# Patient Record
Sex: Male | Born: 1995 | Race: Black or African American | Hispanic: No | Marital: Single | Smoking: Never smoker
Health system: Southern US, Community
[De-identification: ages and names within clinical notes are randomized; demographics above are authoritative.]

## PROBLEM LIST (undated history)

## (undated) DIAGNOSIS — F909 Attention-deficit hyperactivity disorder, unspecified type: Secondary | ICD-10-CM

---
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
## 2014-08-30 ENCOUNTER — Encounter (HOSPITAL_COMMUNITY): Payer: Self-pay | Admitting: Emergency Medicine

## 2014-08-30 ENCOUNTER — Emergency Department (HOSPITAL_COMMUNITY)
Admission: EM | Admit: 2014-08-30 | Discharge: 2014-08-30 | Disposition: A | Discharge status: Home / Self Care | Payer: No Typology Code available for payment source | Attending: Emergency Medicine | Admitting: Emergency Medicine

## 2014-08-30 DIAGNOSIS — Y929 Unspecified place or not applicable: Secondary | ICD-10-CM | POA: Insufficient documentation

## 2014-08-30 DIAGNOSIS — Z79899 Other long term (current) drug therapy: Secondary | ICD-10-CM | POA: Insufficient documentation

## 2014-08-30 DIAGNOSIS — Z880 Allergy status to penicillin: Secondary | ICD-10-CM | POA: Insufficient documentation

## 2014-08-30 DIAGNOSIS — S01112A Laceration without foreign body of left eyelid and periocular area, initial encounter: Secondary | ICD-10-CM | POA: Insufficient documentation

## 2014-08-30 DIAGNOSIS — Y998 Other external cause status: Secondary | ICD-10-CM | POA: Insufficient documentation

## 2014-08-30 DIAGNOSIS — Z8659 Personal history of other mental and behavioral disorders: Secondary | ICD-10-CM | POA: Insufficient documentation

## 2014-08-30 DIAGNOSIS — J45909 Unspecified asthma, uncomplicated: Secondary | ICD-10-CM | POA: Insufficient documentation

## 2014-08-30 DIAGNOSIS — Y9389 Activity, other specified: Secondary | ICD-10-CM | POA: Insufficient documentation

## 2014-08-30 DIAGNOSIS — W2203XA Walked into furniture, initial encounter: Secondary | ICD-10-CM | POA: Insufficient documentation

## 2014-08-30 MED ORDER — LIDOCAINE-EPINEPHRINE 1 %-1:100000 IJ SOLN
10.0000 mL | Freq: Once | INTRAMUSCULAR | Status: AC
Start: 2014-08-30 — End: 2014-08-30
  Administered 2014-08-30: 10 mL
  Filled 2014-08-30: qty 10

## 2014-08-30 MED ORDER — DOUBLE ANTIBIOTIC 500-10000 UNIT/GM EX OINT: TOPICAL_OINTMENT | Freq: Once | CUTANEOUS | Status: DC

## 2014-08-30 MED ORDER — BACITRACIN ZINC 500 UNIT/GM EX OINT
TOPICAL_OINTMENT | CUTANEOUS | Status: AC
  Administered 2014-08-30: 08:00:00 via TOPICAL
  Filled 2014-08-30: qty 0.9

## 2014-08-30 MED ORDER — LIDOCAINE-EPINEPHRINE (PF) 1 %-1:200000 IJ SOLN
INTRAMUSCULAR | Status: AC
  Filled 2014-08-30: qty 10
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
## 2014-08-30 NOTE — Discharge Instructions (Signed)

## 2014-08-30 NOTE — ED Provider Notes (Signed)
CSN: [id number]     Arrival date & time 08/30/14  [id number] History   First MD Initiated Contact with Patient 08/30/14 [phone]     Chief Complaint  Patient presents with  . Laceration     (Consider location/radiation/quality/duration/timing/severity/associated sxs/prior Treatment) HPI Comments: 18 year old male presents shortly after striking his left eyebrow and a door. He had acute onset of a laceration which is mild, persistent, associated with a small amount of bleeding but no loss of consciousness nausea vomiting or change in vision. He has applied pressure to the wound which stopped bleeding, up-to-date on tetanus.  Patient is a 18 y.o. male presenting with skin laceration. The history is provided by the patient.  Laceration   Past Medical History  Diagnosis Date  . Asthma   . ADHD (attention deficit hyperactivity disorder)    History reviewed. No pertinent past surgical history. Family History  Problem Relation Age of Onset  . Diabetes Other   . Cancer Other   . Hyperlipidemia Father    History  Substance Use Topics  . Smoking status: Never Smoker   . Smokeless tobacco: Never Used  . Alcohol Use: No    Review of Systems  Constitutional: Negative for fever.  Gastrointestinal: Negative for vomiting.  Skin: Positive for wound.       Laceration  Neurological: Negative for weakness and numbness.      Allergies  Penicillins  Home Medications   Prior to Admission medications   Medication Sig Start Date End Date Taking? Authorizing Provider  albuterol (PROAIR HFA) 108 (90 BASE) MCG/ACT inhaler Inhale 2 puffs into the lungs as needed. For shortness of breath     Historical Provider, MD  amphetamine-dextroamphetamine (ADDERALL XR) 30 MG 24 hr capsule Take 30 mg by mouth daily.    Historical Provider, MD   BP 125/81 mmHg  Pulse 75  Temp(Src) 97.9 F (36.6 C) (Oral)  Resp 18  Ht 5\' 5"  (1.651 m)  Wt 135 lb (61.236 kg)  BMI 22.47 kg/m2  SpO2 100% Physical Exam   Constitutional: He appears well-developed and well-nourished. No distress.  HENT:  Head: Normocephalic.  1.5 cm laceration to the left eyebrow  Eyes: Conjunctivae are normal. No scleral icterus.  Cardiovascular: Normal rate and regular rhythm.   Pulmonary/Chest: Effort normal and breath sounds normal.  Musculoskeletal: Normal range of motion. He exhibits tenderness ( ttp over the laceration site ). He exhibits no edema.  Neurological: He is alert. Coordination normal.  Sensation and motor intact  Skin: Skin is warm and dry. He is not diaphoretic.  Laceration located on left eyebrow The Laceration is linear shaped The depth is subcutaneous The length is 1.5 cm    ED Course  Procedures (including critical care time) Labs Review Labs Reviewed - No data to display  Imaging Review No results found.    MDM   Final diagnoses:  Laceration of eyebrow, left, initial encounter    LACERATION REPAIR Performed by: Vida RollerMILLER,Aisea Bouldin D Authorized by: Vida RollerMILLER,Mychelle Kendra D Consent: Verbal consent obtained. Risks and benefits: risks, benefits and alternatives were discussed Consent given by: patient Patient identity confirmed: provided demographic data Prepped and Draped in normal sterile fashion Wound explored  Laceration Location: Left eyebrow  Laceration Length: 1.5 cm  No Foreign Bodies seen or palpated  Anesthesia: local infiltration  Local anesthetic: lidocaine 1 % with epinephrine  Anesthetic total: 1 ml  Irrigation method: syringe Amount of cleaning: standard  Skin closure: 6-0 Prolene   Number of sutures: 2  Technique: Simple interrupted   Patient tolerance: Patient tolerated the procedure well with no immediate complications.     Vida RollerBrian D Allia Wiltsey, MD 08/30/14 [phone]

## 2014-08-30 NOTE — ED Notes (Addendum)
EDP at bedside. Suture cart/tray and 1% lidocaine with epi provided per verbal order of EDP.

## 2016-06-08 ENCOUNTER — Ambulatory Visit (HOSPITAL_COMMUNITY)
Admission: AD | Admit: 2016-06-08 | Discharge: 2016-06-08 | Disposition: A | Discharge status: Home / Self Care | Payer: Medicaid Other | Source: Other Acute Inpatient Hospital | Attending: Gastroenterology | Admitting: Gastroenterology

## 2016-06-08 ENCOUNTER — Emergency Department (HOSPITAL_COMMUNITY)
Admission: EM | Admit: 2016-06-08 | Discharge: 2016-06-08 | Disposition: A | Discharge status: Home / Self Care | Payer: Self-pay | Attending: Emergency Medicine | Admitting: Emergency Medicine

## 2016-06-08 ENCOUNTER — Encounter (HOSPITAL_COMMUNITY): Payer: Self-pay | Admitting: Emergency Medicine

## 2016-06-08 ENCOUNTER — Telehealth: Payer: Self-pay | Admitting: Gastroenterology

## 2016-06-08 ENCOUNTER — Other Ambulatory Visit: Payer: Self-pay | Admitting: Gastroenterology

## 2016-06-08 ENCOUNTER — Encounter (HOSPITAL_COMMUNITY): Payer: Self-pay | Admitting: *Deleted

## 2016-06-08 ENCOUNTER — Encounter (HOSPITAL_COMMUNITY)
Admission: AD | Disposition: A | Discharge status: Home / Self Care | Payer: Self-pay | Source: Other Acute Inpatient Hospital | Attending: Gastroenterology

## 2016-06-08 DIAGNOSIS — J45909 Unspecified asthma, uncomplicated: Secondary | ICD-10-CM | POA: Insufficient documentation

## 2016-06-08 DIAGNOSIS — Y929 Unspecified place or not applicable: Secondary | ICD-10-CM | POA: Insufficient documentation

## 2016-06-08 DIAGNOSIS — X58XXXA Exposure to other specified factors, initial encounter: Secondary | ICD-10-CM | POA: Insufficient documentation

## 2016-06-08 DIAGNOSIS — Y9389 Activity, other specified: Secondary | ICD-10-CM | POA: Insufficient documentation

## 2016-06-08 DIAGNOSIS — R1310 Dysphagia, unspecified: Secondary | ICD-10-CM

## 2016-06-08 DIAGNOSIS — K222 Esophageal obstruction: Secondary | ICD-10-CM | POA: Insufficient documentation

## 2016-06-08 DIAGNOSIS — T18108A Unspecified foreign body in esophagus causing other injury, initial encounter: Secondary | ICD-10-CM | POA: Insufficient documentation

## 2016-06-08 DIAGNOSIS — K210 Gastro-esophageal reflux disease with esophagitis: Secondary | ICD-10-CM | POA: Insufficient documentation

## 2016-06-08 DIAGNOSIS — F909 Attention-deficit hyperactivity disorder, unspecified type: Secondary | ICD-10-CM | POA: Insufficient documentation

## 2016-06-08 DIAGNOSIS — Y999 Unspecified external cause status: Secondary | ICD-10-CM | POA: Insufficient documentation

## 2016-06-08 DIAGNOSIS — T18128A Food in esophagus causing other injury, initial encounter: Secondary | ICD-10-CM

## 2016-06-08 HISTORY — PX: ESOPHAGOGASTRODUODENOSCOPY: SHX5428

## 2016-06-08 SURGERY — EGD (ESOPHAGOGASTRODUODENOSCOPY)
Anesthesia: Moderate Sedation

## 2016-06-08 MED ORDER — PROMETHAZINE HCL 25 MG/ML IJ SOLN
INTRAMUSCULAR | Status: AC
  Filled 2016-06-08: qty 1

## 2016-06-08 MED ORDER — LIDOCAINE VISCOUS 2 % MT SOLN
OROMUCOSAL | Status: AC
  Filled 2016-06-08: qty 15

## 2016-06-08 MED ORDER — MINERAL OIL PO OIL
TOPICAL_OIL | ORAL | Status: AC
  Filled 2016-06-08: qty 30

## 2016-06-08 MED ORDER — LIDOCAINE VISCOUS 2 % MT SOLN
OROMUCOSAL | Status: DC | PRN
  Administered 2016-06-08: 4 mL via OROMUCOSAL

## 2016-06-08 MED ORDER — ESOMEPRAZOLE MAGNESIUM 40 MG PO CPDR: DELAYED_RELEASE_CAPSULE | ORAL | 11 refills | Status: DC

## 2016-06-08 MED ORDER — GLUCAGON HCL RDNA (DIAGNOSTIC) 1 MG IJ SOLR
1.0000 mg | Freq: Once | INTRAMUSCULAR | Status: AC
  Administered 2016-06-08: 1 mg via INTRAVENOUS
  Filled 2016-06-08: qty 1

## 2016-06-08 MED ORDER — STERILE WATER FOR IRRIGATION IR SOLN
Status: DC | PRN
  Administered 2016-06-08: 100 mL

## 2016-06-08 MED ORDER — PROMETHAZINE HCL 25 MG/ML IJ SOLN
INTRAMUSCULAR | Status: DC | PRN
  Administered 2016-06-08: 12.5 mg via INTRAVENOUS

## 2016-06-08 MED ORDER — MIDAZOLAM HCL 5 MG/5ML IJ SOLN
INTRAMUSCULAR | Status: AC
  Filled 2016-06-08: qty 5

## 2016-06-08 MED ORDER — MEPERIDINE HCL 100 MG/ML IJ SOLN
INTRAMUSCULAR | Status: DC | PRN
  Administered 2016-06-08: 25 mg via INTRAVENOUS
  Administered 2016-06-08: 50 mg via INTRAVENOUS

## 2016-06-08 MED ORDER — PROMETHAZINE HCL 25 MG/ML IJ SOLN
25.0000 mg | INTRAMUSCULAR | Status: AC
  Administered 2016-06-08: 12.5 mg via INTRAVENOUS

## 2016-06-08 MED ORDER — MEPERIDINE HCL 100 MG/ML IJ SOLN
INTRAMUSCULAR | Status: AC
  Filled 2016-06-08: qty 2

## 2016-06-08 MED ORDER — ONDANSETRON HCL 4 MG/2ML IJ SOLN
4.0000 mg | Freq: Once | INTRAMUSCULAR | Status: AC
  Administered 2016-06-08: 4 mg via INTRAVENOUS
  Filled 2016-06-08: qty 2

## 2016-06-08 MED ORDER — MIDAZOLAM HCL 5 MG/5ML IJ SOLN
INTRAMUSCULAR | Status: DC | PRN
  Administered 2016-06-08 (×2): 1 mg via INTRAVENOUS
  Administered 2016-06-08: 2 mg via INTRAVENOUS
  Administered 2016-06-08: 1 mg via INTRAVENOUS

## 2016-06-08 MED ORDER — SODIUM CHLORIDE 0.9 % IV SOLN
INTRAVENOUS | Status: DC
  Administered 2016-06-08: 17:00:00 via INTRAVENOUS

## 2016-06-08 MED ORDER — MIDAZOLAM HCL 5 MG/5ML IJ SOLN
INTRAMUSCULAR | Status: AC
  Filled 2016-06-08: qty 10

## 2016-06-08 NOTE — Discharge Instructions (Signed)
Call Dr. Darrick Pennafields to schedule outpatient endoscopy to evaluate your esophagus for a stricture.  Nexium daily.  Chew your food well. Swallow only small bites

## 2016-06-08 NOTE — H&P (Addendum)
  Primary Care Physician:  Fredirick MaudlinHAWKINS,EDWARD L, MD Primary Gastroenterologist:  Dr. Darrick PennaFields  Pre-Procedure History & Physical: HPI:  Steven Pittman is a 20 y.o. male here for DYSPHAGIA/POSSIBLE FOOD-PILL IMPACTION. PT HAS INTERMITTENT REFLUX. USES NEXIUM PRN. WAS EATING ROAST BEEF LAST NIGHT AND FELT LIKE IT WAS STUCK. HE TOLD HIS MOTHER @ 6AM. PT SEEN IN ED AND GIVEN GLUCAGON. ABLE TO TOLERATE WATER BUT WENT HOME @9  am  AND WAS UNABLE TO SWALLOW PILLS OR WATER. THREW UP AFTER TRYING TO DRINK WATER (NO BLOOD). PT DENIES USING ASA/NSAID. RARE ETOH.   PT DENIES FEVER, CHILLS, HEMATOCHEZIA, HEMATEMESIS, nausea, melena, diarrhea, CHEST PAIN, SHORTNESS OF BREATH,  CHANGE IN BOWEL IN HABITS, constipation, abdominal pain,OR problems with sedation.    Past Medical History:  Diagnosis Date  . ADHD (attention deficit hyperactivity disorder)   . Asthma     History reviewed. No pertinent surgical history.  Prior to Admission medications   Medication Sig Start Date End Date Taking? Authorizing Provider  amphetamine-dextroamphetamine (ADDERALL XR) 30 MG 24 hr capsule Take 30 mg by mouth daily.   Yes Historical Provider, MD  albuterol (PROAIR HFA) 108 (90 BASE) MCG/ACT inhaler Inhale 2 puffs into the lungs as needed. For shortness of breath     Historical Provider, MD    Allergies as of 06/08/2016 - Review Complete 06/08/2016  Allergen Reaction Noted  . Penicillins Rash 06/06/2011    Family History  Problem Relation Age of Onset  . Diabetes Other   . Cancer Other   . Hyperlipidemia Father     Social History   Social History  . Marital status: Single    Spouse name: N/A  . Number of children: N/A  . Years of education: N/A   Occupational History  . Not on file.   Social History Main Topics  . Smoking status: Never Smoker  . Smokeless tobacco: Never Used  . Alcohol use Yes     Comment: occasionally   . Drug use: No  . Sexual activity: No   Other Topics Concern  . Not on  file   Social History Narrative  . No narrative on file    Review of Systems: See HPI, otherwise negative ROS  Physical Exam: BP 124/79   Pulse 72   Temp 98.6 F (37 C) (Oral)   Resp 16   Ht 5\' 5"  (1.651 m)   Wt 150 lb (68 kg)   SpO2 100%   BMI 24.96 kg/m  General:   Alert,  pleasant and cooperative in NAD Head:  Normocephalic and atraumatic. Neck:  Supple; Lungs:  Clear throughout to auscultation.    Heart:  Regular rate and rhythm. Abdomen:  Soft, nontender and nondistended. Normal bowel sounds, without guarding, and without rebound.   Neurologic:  Alert and  oriented x4;  grossly normal neurologically.  Impression/Plan:    DYSPHAGIA/POSSIBLE FOOD-PILL IMPACTION  PLAN:  EGD/POSSIBLE DIL TODAY. DISCUSSED WITH PATIENT AND MOTHER PROCEDURE, BENEFITS, & RISKS: < 1% chance of medication reaction, PERFORATION, OR bleeding.

## 2016-06-08 NOTE — Telephone Encounter (Signed)
Pt seen in ED FOR FOOD IMPACTION. OPV THIS WEEK, Dx: DYSPHAGIA. NEEDS EGD W/ MAC DUE TO ADDERALL USE.

## 2016-06-08 NOTE — Telephone Encounter (Signed)
MADE APPT AND LEFT MESSAGE ON PATIENT HOME AND CELL NUMBERS

## 2016-06-08 NOTE — Progress Notes (Signed)
Received call from Dr. Juanetta GoslingHawkins. Patient was discharged from ED after being seen for food impaction. After receiving trial of Glucagon, had relief of symptoms and was able to drink 2 full glass of water. Patient was sent home and arranged to be seen by our practice. However, he took his Adderall pill later during the day and felt like it got lodged. He attempted to drink water, which came back up. I spoke to patient personally and asked him to take small sips of water. He then had to vomit multiple times, unable to tolerate liquids. I spoke to his mother, Steven Pittman, on the phone as well. She stated that the patient was then drooling and having difficulty with secretions as well, feeling like he did earlier in the day.   Discussed with Dr. Darrick PennaFields. Patient will go straight to the endoscopy unit. EGD/ED with 25 Phenergan IV in pre-op arranged. Informed Yolanda, who will take Steven Pittman to the endoscopy unit. Orders placed.  Gelene MinkAnna W. Ranjit Ashurst, ANP-BC [facility]

## 2016-06-08 NOTE — Op Note (Signed)
[facility] Patient Name: Steven Pittman Procedure Date: 06/08/2016 4:55 PM MRN: [id number] Date of Birth: January 14, 1996 Attending MD: Jonette Eva , MD CSN: [id number] Age: 20 Admit Type: Outpatient Procedure:                Upper GI endoscopy with ESOPHAGEAL BIOPSY/DILATION Indications:              Dysphagia, PROBABLE FOOD BOLUS in the esophagus Providers:                Jonette Eva, MD, Edrick Kins, RN, Burke Keels,                            Technician Referring MD:             Oneal Deputy. Juanetta Gosling, MD Medicines:                Promethazine 25 mg IV, Meperidine 75 mg IV,                            Midazolam 5 mg IV Complications:            No immediate complications. Estimated Blood Loss:     Estimated blood loss was minimal. Procedure:                Pre-Anesthesia Assessment:                           - Prior to the procedure, a History and Physical                            was performed, and patient medications and                            allergies were reviewed. The patient's tolerance of                            previous anesthesia was also reviewed. The risks                            and benefits of the procedure and the sedation                            options and risks were discussed with the patient.                            All questions were answered, and informed consent                            was obtained. Prior Anticoagulants: The patient has                            taken no previous anticoagulant or antiplatelet                            agents. ASA Grade Assessment: I - A normal, healthy  patient. After reviewing the risks and benefits,                            the patient was deemed in satisfactory condition to                            undergo the procedure. After obtaining informed                            consent, the endoscope was passed under direct                            vision. Throughout  the procedure, the patient's                            blood pressure, pulse, and oxygen saturations were                            monitored continuously. The EG-[id number] ([id number])                            scope was introduced through the mouth, and                            advanced to the second part of duodenum. The upper                            GI endoscopy was somewhat difficult due to the                            patient's agitation. Successful completion of the                            procedure was aided by increasing the dose of                            sedation medication. The patient tolerated the                            procedure fairly well. Scope In: 5:19:03 PM Scope Out: 5:40:27 PM Total Procedure Duration: 0 hours 21 minutes 24 seconds  Findings:      Food was found in the distal esophagus. Removal of food was accomplished.      Esophagitis was found 30 to 39 cm from the incisors. Biopsies were       obtained from the proximal(20 CM FROM THE INCISORS) and distal       esophagus(34 CM FROM THE INCISORS) with cold forceps for histology of       suspected eosinophilic esophagitis. Estimated blood loss was minimal.      The entire examined stomach was normal.      One moderate benign-appearing, intrinsic stricture was found 39 cm(EG       jxn) from the incisors. And was traversed. A guidewire was placed and       the scope was withdrawn. Dilation was  performed with a Savary dilator       with mild resistance at 12.8 mm and 14 mm and moderate resistance at 15       mm and 16 mm. TRACE HEME SEEN AFTER DILATION.      The examined duodenum was normal. Impression:               - Food in the distal esophagus. Removal was                            successfuLLY VIA ROTH NET.                           - MILD Reflux esophagitis.                           - Benign-appearing esophageal PEPTIC STRICTURE                           - Moderate Sedation:      Moderate  (conscious) sedation was administered by the endoscopy nurse       and supervised by the endoscopist. The following parameters were       monitored: oxygen saturation, heart rate, blood pressure, and response       to care. Total physician intraservice time was 27 minutes. Recommendation:           - Resume previous diet. AVOID REFLUX TRIGGERS.                           - Use Nexium (esomeprazole) 40 mg PO BID                            indefinitely.                           - Await pathology results. CALL IN 2-3 WEEKS IF                            SYMPTOMS ARE NOT COMPLETELY RESOLVED.                           - Return to my office in 3 months.                           - Patient has a contact number available for                            emergencies. The signs and symptoms of potential                            delayed complications were discussed with the                            patient. Return to normal activities tomorrow.                            Written discharge instructions were  provided to the                            patient.                           - Post procedure medication instructions were                            provided to the patient. Procedure Code(s):        --- Professional ---                           [id number], Esophagogastroduodenoscopy, flexible,                            transoral; with removal of foreign body(s)                           43248, Esophagogastroduodenoscopy, flexible,                            transoral; with insertion of guide wire followed by                            passage of dilator(s) through esophagus over guide                            wire                           43239, Esophagogastroduodenoscopy, flexible,                            transoral; with biopsy, single or multiple                           99152, Moderate sedation services provided by the                            same physician or other qualified health  care                            professional performing the diagnostic or                            therapeutic service that the sedation supports,                            requiring the presence of an independent trained                            observer to assist in the monitoring of the                            patient's level of consciousness and physiological  status; initial 15 minutes of intraservice time,                            patient age 33 years or older                           [phone], Moderate sedation services; each additional                            15 minutes intraservice time Diagnosis Code(s):        --- Professional ---                           [phone], Food in esophagus causing other injury,                            initial encounter                           K21.0, Gastro-esophageal reflux disease with                            esophagitis                           K22.2, Esophageal obstruction                           R13.10, Dysphagia, unspecified                           T18.108A, Unspecified foreign body in esophagus                            causing other injury, initial encounter CPT copyright 2016 American [employer] Association. All rights reserved. The codes documented in this report are preliminary and upon coder review may  be revised to meet current compliance requirements. Jonette Eva, MD Jonette Eva, MD 06/08/2016 5:57:49 PM This report has been signed electronically. Number of Addenda: 0

## 2016-06-08 NOTE — ED Notes (Signed)
Pt complain of food bolus lodged in his throat after eating roast beef yesterday. States he drank Gatorade and water but the food did not go down. No respiratory difficulty noted

## 2016-06-08 NOTE — Discharge Instructions (Signed)
I dilated your esophagus. You have a stricture near the base of your esophagus THAT IS MOST LIKELY DUE TO ACID REFLUX.  I biopsied THE TOP AND THE BOTTOM OF YOUR ESOPHAGUS.Marland Kitchen   AVOID REFLUX TRIGGERS. SEE INFO BELOW.  TAKE NEXIUM 30 MINUTES BEFORE MEALS TWICE DAILY.  YOUR BIOPSY RESULTS WILL BE AVAILABLE IN MY CHART AFTER SEP 8 AND MY OFFICE WILL CONTACT YOU IN 10-14 DAYS WITH YOUR RESULTS.   FOLLOW UP IN 3 MOS.  UPPER ENDOSCOPY AFTER CARE Read the instructions outlined below and refer to this sheet in the next week. These discharge instructions provide you with general information on caring for yourself after you leave the hospital. While your treatment has been planned according to the most current medical practices available, unavoidable complications occasionally occur. If you have any problems or questions after discharge, call DR. Orlo Brickle, [phone].  ACTIVITY  You may resume your regular activity, but move at a slower pace for the next 24 hours.   Take frequent rest periods for the next 24 hours.   Walking will help get rid of the air and reduce the bloated feeling in your belly (abdomen).   No driving for 24 hours (because of the medicine (anesthesia) used during the test).   You may shower.   Do not sign any important legal documents or operate any machinery for 24 hours (because of the anesthesia used during the test).    NUTRITION  Drink plenty of fluids.   You may resume your normal diet as instructed by your doctor.   Begin with a light meal and progress to your normal diet. Heavy or fried foods are harder to digest and may make you feel sick to your stomach (nauseated).   Avoid alcoholic beverages for 24 hours or as instructed.    MEDICATIONS  You may resume your normal medications.   WHAT YOU CAN EXPECT TODAY  Some feelings of bloating in the abdomen.   Passage of more gas than usual.    IF YOU HAD A BIOPSY TAKEN DURING THE UPPER ENDOSCOPY:  Eat a  soft diet IF YOU HAVE NAUSEA, BLOATING, ABDOMINAL PAIN, OR VOMITING.    FINDING OUT THE RESULTS OF YOUR TEST Not all test results are available during your visit. DR. Darrick Penna WILL CALL YOU WITHIN 14 DAYS OF YOUR PROCEDUE WITH YOUR RESULTS. Do not assume everything is normal if you have not heard from DR. Nicolis Boody, CALL HER OFFICE AT [phone].  SEEK IMMEDIATE MEDICAL ATTENTION AND CALL THE OFFICE: [phone] IF:  You have more than a spotting of blood in your stool.   Your belly is swollen (abdominal distention).   You are nauseated or vomiting.   You have a temperature over 101F.   You have abdominal pain or discomfort that is severe or gets worse throughout the day.    ESOPHAGEAL STRICTURE  Esophageal strictures can be caused by stomach acid backing up into the tube that carries food from the mouth down to the stomach (lower esophagus).  TREATMENT There are a number of medicines used to treat reflux/stricture, including: Antacids.  ZANTAC OR PEPCID Proton-pump inhibitors: NEXIUM  HOME CARE INSTRUCTIONS Eat 2-3 hours before going to bed.  Try to reach and maintain a healthy weight.  Do not eat just a few very large meals. Instead, eat 4 TO 6 smaller meals throughout the day.  Try to identify foods and beverages that make your symptoms worse, and avoid these.  Avoid tight clothing.  Do not exercise  right after eating.    Lifestyle and home remedies TO CONTROL REFLUX You may eliminate or reduce the frequency of heartburn by making the following lifestyle changes:   Control your weight. Being overweight is a major risk factor for heartburn and GERD. Excess pounds put pressure on your abdomen, pushing up your stomach and causing acid to back up into your esophagus.    Eat smaller meals. 4 TO 6 MEALS A DAY. This reduces pressure on the lower esophageal sphincter, helping to prevent the valve from opening and acid from washing back into your esophagus.    Loosen your  belt. Clothes that fit tightly around your waist put pressure on your abdomen and the lower esophageal sphincter.    Eliminate heartburn triggers. Everyone has specific triggers.Common triggers such as fatty or fried foods, spicy food, tomato sauce, carbonated beverages, alcohol, chocolate, mint, garlic, onion, caffeine and nicotine may make heartburn worse.    Avoid stooping or bending. Tying your shoes is OK. Bending over for longer periods to weed your garden isn't, especially soon after eating.    Don't lie down after a meal. Wait at least three to four hours after eating before going to bed, and don't lie down right after eating.   Alternative medicine  Several home remedies exist for treating GERD, but they provide only temporary relief. They include drinking baking soda (sodium bicarbonate) added to water or drinking other fluids such as baking soda mixed with cream of tartar and water.  Although these liquids create temporary relief by neutralizing, washing away or buffering acids, eventually they aggravate the situation by adding gas and fluid to your stomach, increasing pressure and causing more acid reflux. Further, adding more sodium to your diet may increase your blood pressure and add stress to your heart, and excessive bicarbonate ingestion can alter the acid-base balance in your body.

## 2016-06-08 NOTE — ED Triage Notes (Signed)
Pt reports eating roast beef last night when he felt like it "got stuck" in his throat. Pt states every time he tries to drink water he vomits it back up. NAD noted. Airway patent.

## 2016-06-08 NOTE — ED Provider Notes (Signed)
[facility]-EMERGENCY DEPT Provider Note   CSN: [id number] Arrival date & time: 06/08/16  0640     History   Chief Complaint Chief Complaint  Patient presents with  . Dysphagia    HPI Steven Pittman is a 20 y.o. male.  No significant history of GI issues. No previous esophageal impactions or strictures. He takes Nexium as needed for reflux.  Stating that since last night between 5 and 7 he's had some roast be stuck in his esophagus. He was eating macaroni and cheese and roast beef. Had to stop eating when he felt like it had become stuck. Try drinking some Powerade later and regurgitated it quickly. Point to his upper sternal area where he feels like some media still stuck. He's been having despite his secretions since last night.  HPI  Past Medical History:  Diagnosis Date  . ADHD (attention deficit hyperactivity disorder)   . Asthma     There are no active problems to display for this patient.   History reviewed. No pertinent surgical history.     Home Medications    Prior to Admission medications   Medication Sig Start Date End Date Taking? Authorizing Provider  albuterol (PROAIR HFA) 108 (90 BASE) MCG/ACT inhaler Inhale 2 puffs into the lungs as needed. For shortness of breath     Historical Provider, MD  amphetamine-dextroamphetamine (ADDERALL XR) 30 MG 24 hr capsule Take 30 mg by mouth daily.    Historical Provider, MD    Family History Family History  Problem Relation Age of Onset  . Diabetes Other   . Cancer Other   . Hyperlipidemia Father     Social History Social History  Substance Use Topics  . Smoking status: Never Smoker  . Smokeless tobacco: Never Used  . Alcohol use Yes     Comment: occasionally      Allergies   Penicillins   Review of Systems Review of Systems  Constitutional: Negative for appetite change, chills, diaphoresis, fatigue and fever.  HENT: Positive for drooling. Negative for mouth sores, sore throat and trouble  swallowing.   Eyes: Negative for visual disturbance.  Respiratory: Negative for cough, chest tightness, shortness of breath and wheezing.   Cardiovascular: Negative for chest pain.  Gastrointestinal: Positive for vomiting. Negative for abdominal distention, abdominal pain, diarrhea and nausea.  Endocrine: Negative for polydipsia, polyphagia and polyuria.  Genitourinary: Negative for dysuria, frequency and hematuria.  Musculoskeletal: Negative for gait problem.  Skin: Negative for color change, pallor and rash.  Neurological: Negative for dizziness, syncope, light-headedness and headaches.  Hematological: Does not bruise/bleed easily.  Psychiatric/Behavioral: Negative for behavioral problems and confusion.     Physical Exam Updated Vital Signs BP 125/87 (BP Location: Right Arm)   Pulse 79   Temp 97.9 F (36.6 C) (Oral)   Resp 14   Ht 5\' 5"  (1.651 m)   Wt 150 lb (68 kg)   SpO2 100%   BMI 24.96 kg/m   Physical Exam  Constitutional: He is oriented to person, place, and time. He appears well-developed and well-nourished. No distress.  HENT:  Head: Normocephalic.  Eyes: Conjunctivae are normal. Pupils are equal, round, and reactive to light. No scleral icterus.  Neck: Normal range of motion. Neck supple. No thyromegaly present.  Cardiovascular: Normal rate and regular rhythm.  Exam reveals no gallop and no friction rub.   No murmur heard. Patient points to an area of his upper sternum where he feels as though the meat is lodged.  Pulmonary/Chest: Effort  normal and breath sounds normal. No respiratory distress. He has no wheezes. He has no rales.  Abdominal: Soft. Bowel sounds are normal. He exhibits no distension. There is no tenderness. There is no rebound.  Musculoskeletal: Normal range of motion.  Neurological: He is alert and oriented to person, place, and time.  Skin: Skin is warm and dry. No rash noted.  Psychiatric: He has a normal mood and affect. His behavior is normal.       ED Treatments / Results  Labs (all labs ordered are listed, but only abnormal results are displayed) Labs Reviewed - No data to display  EKG  EKG Interpretation None       Radiology No results found.  Procedures Procedures (including critical care time)  Medications Ordered in ED Medications  ondansetron (ZOFRAN) injection 4 mg (4 mg Intravenous Given 06/08/16 0736)  glucagon (human recombinant) (GLUCAGEN) injection 1 mg (1 mg Intravenous Given 06/08/16 0736)     Initial Impression / Assessment and Plan / ED Course  I have reviewed the triage vital signs and the nursing notes.  Pertinent labs & imaging results that were available during my care of the patient were reviewed by me and considered in my medical decision making (see chart for details).  Clinical Course    Will give trial of glucagon.  Final Clinical Impressions(s) / ED Diagnoses   Final diagnoses:  Esophageal foreign body, initial encounter   After glucagon patient had relief of symptoms was able to drink 2 full glasses of water without regurgitation. I have referred him to GI and recommended that he call for follow-up for discussion of outpatient endoscopy to rule out stricture. In the meanwhile chew food well. Eat only small bites.   New Prescriptions New Prescriptions   No medications on file     Rolland PorterMark Mycheal Veldhuizen, MD 06/08/16 [phone]

## 2016-06-09 ENCOUNTER — Ambulatory Visit: Payer: Medicaid Other | Admitting: Nurse Practitioner

## 2016-06-14 ENCOUNTER — Encounter (HOSPITAL_COMMUNITY): Payer: Self-pay | Admitting: Gastroenterology

## 2016-06-20 ENCOUNTER — Telehealth: Payer: Self-pay | Admitting: Gastroenterology

## 2016-06-20 NOTE — Telephone Encounter (Signed)
Called patient TO DISCUSS RESULTS. LVM-CALL [phone] TODAY OR [phone] TOMORROW TO DISCUSS. ESOPHAGEAL BIOPSIES SHOW REFLUX.     AVOID REFLUX TRIGGERS.   TAKE NEXIUM 30 MINUTES BEFORE MEALS TWICE DAILY.  FOLLOW UP IN 3 MOS E30 DYSPHAGIA.

## 2016-06-21 NOTE — Telephone Encounter (Signed)
ON RECALL  °

## 2016-06-21 NOTE — Telephone Encounter (Signed)
Reminder in epic °

## 2016-08-09 ENCOUNTER — Encounter: Payer: Self-pay | Admitting: Gastroenterology

## 2016-09-03 MED FILL — AMPHETAMINE SALTS 30 MG TAB: 30 | 90 days supply | Qty: 90 | Fill #0

## 2016-12-02 MED FILL — AMPHETAMINE SALTS 30 MG TAB: 30 | 90 days supply | Qty: 90 | Fill #0

## 2017-01-31 DIAGNOSIS — F902 Attention-deficit hyperactivity disorder, combined type: Secondary | ICD-10-CM | POA: Diagnosis not present

## 2017-01-31 DIAGNOSIS — J029 Acute pharyngitis, unspecified: Secondary | ICD-10-CM | POA: Diagnosis not present

## 2017-03-04 MED FILL — AMPHETAMINE SALTS 30 MG TAB: 30 | 90 days supply | Qty: 90 | Fill #0

## 2017-06-14 MED FILL — AMPHETAMINE SALTS 30 MG TAB: 30 | 90 days supply | Qty: 90 | Fill #0

## 2017-09-14 MED FILL — AMPHETAMINE SALTS 30 MG TAB: 30 | 90 days supply | Qty: 90 | Fill #0

## 2017-12-23 MED FILL — AMPHETAMINE SALTS 30 MG TAB: 30 | 90 days supply | Qty: 90 | Fill #0

## 2018-02-08 DIAGNOSIS — H52223 Regular astigmatism, bilateral: Secondary | ICD-10-CM | POA: Diagnosis not present

## 2018-03-24 MED FILL — AMPHETAMINE SALTS 30 MG TAB: 30 | 90 days supply | Qty: 90 | Fill #0

## 2018-06-27 MED FILL — AMPHETAMINE SALTS 30 MG TAB: 30 | 90 days supply | Qty: 90 | Fill #0

## 2018-07-14 DIAGNOSIS — Z23 Encounter for immunization: Secondary | ICD-10-CM | POA: Diagnosis not present

## 2018-09-25 DIAGNOSIS — F902 Attention-deficit hyperactivity disorder, combined type: Secondary | ICD-10-CM | POA: Diagnosis not present

## 2018-09-26 MED FILL — AMPHETAMINE SALTS 30 MG TAB: 30 | 90 days supply | Qty: 90 | Fill #0

## 2018-11-10 MED FILL — CHLORHEXIDINE 0.12% RINSE: 0.12 | 15 days supply | Qty: 473 | Fill #0

## 2018-11-10 MED FILL — HYDROCODON-APAP 5-325: 5-325 | 5 days supply | Qty: 20 | Fill #0

## 2018-12-27 MED FILL — AMPHETAMINE-DEXTROAMPHETAMI: 30 | 90 days supply | Qty: 90 | Fill #0

## 2019-03-26 MED FILL — AMPHETAMINE-DEXTROAMPHETAMI: 30 | 90 days supply | Qty: 90 | Fill #0

## 2019-06-29 MED FILL — AMPHETAMINE-DEXTROAMPHETAMI: 30 | 90 days supply | Qty: 90 | Fill #0

## 2019-09-10 MED FILL — ADDERALL XR 30 MG CAP SA: 30 | 90 days supply | Qty: 90 | Fill #0

## 2019-12-12 MED FILL — ADDERALL XR 30 MG CAP SA: 30 | 90 days supply | Qty: 90 | Fill #0

## 2020-04-02 MED FILL — ADDERALL XR 30 MG CAP SA: 30 | 90 days supply | Qty: 90 | Fill #0

## 2020-07-01 MED FILL — ADDERALL XR 30 MG CAP SA: 30 | 90 days supply | Qty: 90 | Fill #0

## 2020-12-11 ENCOUNTER — Other Ambulatory Visit (HOSPITAL_COMMUNITY): Payer: Self-pay | Admitting: Physician Assistant

## 2021-07-30 ENCOUNTER — Other Ambulatory Visit: Payer: Self-pay

## 2021-07-30 ENCOUNTER — Emergency Department (HOSPITAL_COMMUNITY)
Admission: EM | Admit: 2021-07-30 | Discharge: 2021-07-30 | Disposition: A | Discharge status: Home / Self Care | Payer: BC Managed Care – PPO | Attending: Emergency Medicine | Admitting: Emergency Medicine

## 2021-07-30 ENCOUNTER — Encounter (HOSPITAL_COMMUNITY): Payer: Self-pay

## 2021-07-30 DIAGNOSIS — J45909 Unspecified asthma, uncomplicated: Secondary | ICD-10-CM | POA: Diagnosis not present

## 2021-07-30 DIAGNOSIS — K0889 Other specified disorders of teeth and supporting structures: Secondary | ICD-10-CM

## 2021-07-30 DIAGNOSIS — K047 Periapical abscess without sinus: Secondary | ICD-10-CM

## 2021-07-30 MED ORDER — CLINDAMYCIN HCL 300 MG PO CAPS
300.0000 mg | ORAL_CAPSULE | Freq: Three times a day (TID) | ORAL | 0 refills | Status: DC
Start: 2021-07-30 — End: 2022-10-26

## 2021-07-30 MED ORDER — OXYCODONE-ACETAMINOPHEN 5-325 MG PO TABS
1.0000 | ORAL_TABLET | Freq: Once | ORAL | Status: AC
  Administered 2021-07-30: 1 via ORAL
  Filled 2021-07-30: qty 1

## 2021-07-30 MED ORDER — OXYCODONE-ACETAMINOPHEN 5-325 MG PO TABS: 1.0000 | ORAL_TABLET | Freq: Four times a day (QID) | ORAL | 0 refills | Status: DC | PRN

## 2021-07-30 MED ORDER — CLINDAMYCIN HCL 150 MG PO CAPS
300.0000 mg | ORAL_CAPSULE | Freq: Once | ORAL | Status: AC
  Administered 2021-07-30: 300 mg via ORAL
  Filled 2021-07-30: qty 2

## 2021-07-30 MED ORDER — IBUPROFEN 400 MG PO TABS
400.0000 mg | ORAL_TABLET | Freq: Once | ORAL | Status: AC
  Administered 2021-07-30: 400 mg via ORAL
  Filled 2021-07-30: qty 1

## 2021-07-30 NOTE — ED Triage Notes (Signed)
Pov from home with cc of dental pain.  Bottom left.  Has apt tomorrow but pain is too unbearable.

## 2021-07-30 NOTE — Discharge Instructions (Signed)
It was our pleasure to provide your ER care today - we hope that you feel better.  Take motrin or aleve as need for pain. You may also take percocet as need for pain. No driving for the next 6 hours or when taking percocet. Also, do not take tylenol or acetaminophen containing medication when taking percocet.   Take clindamycin (antibiotic) as prescribed.   Follow up with dentist tomorrow as planned.   Return to ER if worse, high fevers, severe facial swelling, trouble breathing or swallowing, or other concern.

## 2021-07-30 NOTE — ED Provider Notes (Signed)
[facility] EMERGENCY DEPARTMENT Provider Note   CSN: [id number] Arrival date & time: 07/30/21  1919     History Chief Complaint  Patient presents with   Dental Pain    Steven Pittman is a 25 y.o. male.  Patient c/o left lower dental pain in past day. Symptoms acute onset, mod-sever, constant, dull, non radiating. No recent trauma to mouth/tooth. States tooth had bothered him in past, but them improved for a period of time. Has appt with dentist tomorrow. Took acetaminophen and ibuprofen earlier this AM, but pain persists. No throat swelling or pain. No trouble breathing or swallowing, no neck pain. No gross facial swelling or redness.   The history is provided by the patient, a relative and medical records.  Dental Pain Associated symptoms: no fever, no headaches and no neck pain       Past Medical History:  Diagnosis Date   ADHD (attention deficit hyperactivity disorder)    Asthma     There are no problems to display for this patient.   Past Surgical History:  Procedure Laterality Date   ESOPHAGOGASTRODUODENOSCOPY N/A 06/08/2016   Procedure: ESOPHAGOGASTRODUODENOSCOPY (EGD);  Surgeon: West Bali, MD;  Location: [facility];  Service: Endoscopy;  Laterality: N/A;  WITH DILATION   NEEDS PHENERGAN IN PREOP        Family History  Problem Relation Age of Onset   Diabetes Other    Cancer Other    Hyperlipidemia Father     Social History   Tobacco Use   Smoking status: Never   Smokeless tobacco: Never  Substance Use Topics   Alcohol use: Yes    Comment: occasionally    Drug use: No    Home Medications Prior to Admission medications   Medication Sig Start Date End Date Taking? Authorizing Provider  ADDERALL XR 30 MG 24 hr capsule TAKE 1 CAPSULE BY MOUTH ONCE A DAY 12/11/20 06/09/21  Shawnie Dapper, PA-C  albuterol (PROAIR HFA) 108 (90 BASE) MCG/ACT inhaler Inhale 2 puffs into the lungs as needed. For shortness of breath     [provider]  amphetamine-dextroamphetamine (ADDERALL XR) 30 MG 24 hr capsule Take 30 mg by mouth daily.    [provider]  esomeprazole (NEXIUM) 40 MG capsule 1 PO 30 MINUTES PRIOR TO MEALS BID 06/08/16   Fields, Darleene Cleaver, MD    Allergies    Penicillins  Review of Systems   Review of Systems  Constitutional:  Negative for chills and fever.  HENT:  Negative for sore throat and trouble swallowing.   Respiratory:  Negative for shortness of breath.   Musculoskeletal:  Negative for neck pain.  Neurological:  Negative for headaches.   Physical Exam Updated Vital Signs BP 118/74 (BP Location: Right Arm)   Pulse 60   Temp 97.9 F (36.6 C) (Oral)   Resp 20   Ht 1.651 m (5\' 5" )   Wt 64.4 kg   SpO2 98%   BMI 23.63 kg/m   Physical Exam Vitals and nursing note reviewed.  Constitutional:      Appearance: Normal appearance. He is well-developed.  HENT:     Head: Atraumatic.     Nose: Nose normal.     Mouth/Throat:     Mouth: Mucous membranes are moist.     Pharynx: Oropharynx is clear.     Comments: Left lower, several prior absent teeth, most posteriorly tooth on left lower w posterior part of tooth broken off (?chronically so), with  mild gum swelling and tenderness of tooth - ?dental abscess. No trismus. Pharynx normal. No swelling, pain or tenderness to floor of mouth or neck.  Eyes:     General: No scleral icterus.    Conjunctiva/sclera: Conjunctivae normal.  Neck:     Trachea: No tracheal deviation.  Cardiovascular:     Rate and Rhythm: Normal rate.  Pulmonary:     Effort: Pulmonary effort is normal. No accessory muscle usage or respiratory distress.  Genitourinary:    Comments: No cva tenderness. Musculoskeletal:        General: No swelling.     Cervical back: Normal range of motion and neck supple. No rigidity.  Lymphadenopathy:     Cervical: No cervical adenopathy.  Skin:    General: Skin is warm and dry.     Findings: No rash.  Neurological:     Mental Status: He  is alert.     Comments: Alert, speech clear.   Psychiatric:        Mood and Affect: Mood normal.    ED Results / Procedures / Treatments   Labs (all labs ordered are listed, but only abnormal results are displayed) Labs Reviewed - No data to display  EKG None  Radiology No results found.  Procedures Procedures   Medications Ordered in ED Medications  clindamycin (CLEOCIN) capsule 300 mg (has no administration in time range)  oxyCODONE-acetaminophen (PERCOCET/ROXICET) 5-325 MG per tablet 1 tablet (has no administration in time range)  ibuprofen (ADVIL) tablet 400 mg (has no administration in time range)    ED Course  I have reviewed the triage vital signs and the nursing notes.  Pertinent labs & imaging results that were available during my care of the patient were reviewed by me and considered in my medical decision making (see chart for details).    MDM Rules/Calculators/A&P                           Pt has ride, does not have to drive. Allergy listed to pcn.   Clindamycin po. Percocet 1 po, ibuprofen po.   Pt indicates has f/u tomorrow w dentist - rec keeping that appt.   Return precautions provided.    Final Clinical Impression(s) / ED Diagnoses Final diagnoses:  None    Rx / DC Orders ED Discharge Orders     None        Cathren Laine, MD 07/30/21 1954

## 2022-02-25 ENCOUNTER — Emergency Department (HOSPITAL_COMMUNITY)
Admission: EM | Admit: 2022-02-25 | Discharge: 2022-02-25 | Disposition: A | Discharge status: Home / Self Care | Payer: BC Managed Care – PPO | Attending: Emergency Medicine | Admitting: Emergency Medicine

## 2022-02-25 ENCOUNTER — Encounter (HOSPITAL_COMMUNITY): Payer: Self-pay | Admitting: *Deleted

## 2022-02-25 ENCOUNTER — Other Ambulatory Visit: Payer: Self-pay

## 2022-02-25 DIAGNOSIS — T17228A Food in pharynx causing other injury, initial encounter: Secondary | ICD-10-CM | POA: Insufficient documentation

## 2022-02-25 DIAGNOSIS — R0989 Other specified symptoms and signs involving the circulatory and respiratory systems: Secondary | ICD-10-CM

## 2022-02-25 DIAGNOSIS — X58XXXA Exposure to other specified factors, initial encounter: Secondary | ICD-10-CM | POA: Diagnosis not present

## 2022-02-25 DIAGNOSIS — R059 Cough, unspecified: Secondary | ICD-10-CM | POA: Insufficient documentation

## 2022-02-25 MED ORDER — METHOCARBAMOL 500 MG PO TABS: 500.0000 mg | ORAL_TABLET | Freq: Every evening | ORAL | 0 refills | Status: AC | PRN

## 2022-02-25 NOTE — ED Triage Notes (Addendum)
Pt reports a few days ago he was eating a chicken wrap and felt like "it went down the wrong pipe". Pt reports he coughed and then felt better. Ever since then he has continued to feel like something is stuck in his throat. Pt reports hx of food impaction with esophageal dilatation.

## 2022-02-25 NOTE — ED Provider Notes (Signed)
[facility] EMERGENCY DEPARTMENT Provider Note   CSN: [id number] Arrival date & time: 02/25/22  5361     History  CC: Foreign body sensation  Steven Pittman is a 26 y.o. male present emergency department with a food sensation stuck in throat.  Patient reports that approximately 4 days ago, he states he was "smoking in a car" and he aspirated a small bit of the smoking products, which caused him to begin coughing.  He subsequently tried to swallow a bite of chicken wrap sandwich, feels that he aspirated on that, had a big coughing fit.  Ever since then he has reported intermittent sensation of the "knot" in his upper throat.  He said he did not feel it right away, but noticed that the past 2 days.  He is able to eat, swallow, and talk normally.  He denies any issues with gagging while swallowing.  He does report he had a food impaction several years ago that required endoscopy when he choked on a piece of roast beef.  He denies any other medical problems.  HPI     Home Medications Prior to Admission medications   Medication Sig Start Date End Date Taking? Authorizing Provider  methocarbamol (ROBAXIN) 500 MG tablet Take 1 tablet (500 mg total) by mouth at bedtime as needed for up to 10 days for muscle spasms. 02/25/22 03/07/22 Yes Terald Sleeper, MD  ADDERALL XR 30 MG 24 hr capsule TAKE 1 CAPSULE BY MOUTH ONCE A DAY 12/11/20 06/09/21  Shawnie Dapper, PA-C  albuterol (PROAIR HFA) 108 (90 BASE) MCG/ACT inhaler Inhale 2 puffs into the lungs as needed. For shortness of breath     [provider]  amphetamine-dextroamphetamine (ADDERALL XR) 30 MG 24 hr capsule Take 30 mg by mouth daily.    [provider]  clindamycin (CLEOCIN) 300 MG capsule Take 1 capsule (300 mg total) by mouth 3 (three) times daily. 07/30/21   Cathren Laine, MD  esomeprazole (NEXIUM) 40 MG capsule 1 PO 30 MINUTES PRIOR TO MEALS BID 06/08/16   Fields, Darleene Cleaver, MD  oxyCODONE-acetaminophen  (PERCOCET/ROXICET) 5-325 MG tablet Take 1-2 tablets by mouth every 6 (six) hours as needed for severe pain. 07/30/21   Cathren Laine, MD      Allergies    Penicillins    Review of Systems   Review of Systems  Physical Exam Updated Vital Signs BP 125/84 (BP Location: Left Arm)   Pulse (!) 57   Temp 98.3 F (36.8 C) (Oral)   Resp 20   Ht 5\' 4"  (1.626 m)   Wt 59 kg   SpO2 100%   BMI 22.31 kg/m  Physical Exam Constitutional:      General: He is not in acute distress. HENT:     Head: Normocephalic and atraumatic.     Comments: Patient's voice is clear, not muffled, he is tolerating secretions easily.  No foreign body noted in the posterior oropharynx Eyes:     Conjunctiva/sclera: Conjunctivae normal.     Pupils: Pupils are equal, round, and reactive to light.  Cardiovascular:     Rate and Rhythm: Normal rate and regular rhythm.  Pulmonary:     Effort: Pulmonary effort is normal. No respiratory distress.  Skin:    General: Skin is warm and dry.  Neurological:     General: No focal deficit present.     Mental Status: He is alert. Mental status is at baseline.  Psychiatric:        Mood  and Affect: Mood normal.        Behavior: Behavior normal.    ED Results / Procedures / Treatments   Labs (all labs ordered are listed, but only abnormal results are displayed) Labs Reviewed - No data to display  EKG None  Radiology No results found.  Procedures Procedures    Medications Ordered in ED Medications - No data to display  ED Course/ Medical Decision Making/ A&P                           Medical Decision Making Risk Prescription drug management.   Patient is here with foreign body sensation in throat after a possible aspiration event 4 days ago.  I strongly suspect this is a globus sensation.  He does not demonstrate any difficulties with swallowing, is not having gagging or difficulty drinking or eating at home.  The symptoms are also intermittent and not  immediately present after the aspiration, which again is more consistent with globus sensation.  I advised that this is all generally conservative management.  We can try muscle relaxer, although I made him aware that this may not have much effect, but it may help him sleep at night.  If he has worsening choking he can come back to the ER.  We can place a referral to gastroenterology given that he has had recurring episodes of globus sensation and/or food impaction, and may require further anatomical imaging or studies as an outpatient.  He verbalized understanding.  Do not believe there is any utility in x-ray imaging at this time as x-ray is not likely to show any type of foreign body, and I doubt an impaction.        Final Clinical Impression(s) / ED Diagnoses Final diagnoses:  Globus sensation    Rx / DC Orders ED Discharge Orders          Ordered    Ambulatory referral to Gastroenterology        02/25/22 0746    methocarbamol (ROBAXIN) 500 MG tablet  At bedtime PRN        02/25/22 0746              Terald Sleeper, MD 02/25/22 [phone]

## 2022-06-28 ENCOUNTER — Encounter: Payer: Self-pay | Admitting: Emergency Medicine

## 2022-10-26 ENCOUNTER — Ambulatory Visit
Admission: RE | Admit: 2022-10-26 | Discharge: 2022-10-26 | Disposition: A | Discharge status: Home / Self Care | Payer: BC Managed Care – PPO | Source: Ambulatory Visit | Attending: Nurse Practitioner | Admitting: Nurse Practitioner

## 2022-10-26 VITALS — BP 111/73 | HR 78 | Temp 98.6°F | Resp 20

## 2022-10-26 DIAGNOSIS — Z1152 Encounter for screening for COVID-19: Secondary | ICD-10-CM | POA: Insufficient documentation

## 2022-10-26 DIAGNOSIS — J069 Acute upper respiratory infection, unspecified: Secondary | ICD-10-CM | POA: Insufficient documentation

## 2022-10-26 NOTE — ED Provider Notes (Signed)
[facility]    CSN: [id number] Arrival date & time: 10/26/22  0915      History   Chief Complaint Chief Complaint  Patient presents with   Fever    Entered by patient    HPI Steven Pittman is a 27 y.o. male.   Mom patient presents today for 2-day history of bodyaches, chills, hot sweats, low-grade fevers at home.  Also endorses productive cough, nasal congestion and runny nose, postnasal drainage and sore throat, headache.  He denies shortness of breath, chest pain, chest congestion, ear pain, abdominal pain, nausea/vomiting, diarrhea, decreased appetite, loss of taste or smell, or new fatigue.  He has not taken a COVID-19 test at home yet.  No known sick contacts, however some of his siblings have been sick with similar symptoms.  Has been taking Tylenol for the body aches which does help per his report.  Patient denies history of any type of chronic lung disease, however it does appear he has required an albuterol inhaler in the past.    Past Medical History:  Diagnosis Date   ADHD (attention deficit hyperactivity disorder)    Asthma     There are no problems to display for this patient.   Past Surgical History:  Procedure Laterality Date   ESOPHAGOGASTRODUODENOSCOPY N/A 06/08/2016   Procedure: ESOPHAGOGASTRODUODENOSCOPY (EGD);  Surgeon: Danie Binder, MD;  Location: [facility];  Service: Endoscopy;  Laterality: N/A;  WITH DILATION   NEEDS PHENERGAN IN PREOP        Home Medications    Prior to Admission medications   Medication Sig Start Date End Date Taking? Authorizing Provider  ADDERALL XR 30 MG 24 hr capsule TAKE 1 CAPSULE BY MOUTH ONCE A DAY 12/11/20 06/09/21  Cory Munch, PA-C  albuterol (PROAIR HFA) 108 (90 BASE) MCG/ACT inhaler Inhale 2 puffs into the lungs as needed. For shortness of breath     [provider]  amphetamine-dextroamphetamine (ADDERALL XR) 30 MG 24 hr capsule Take 30 mg by mouth daily.    [provider]    Family History Family History  Problem Relation Age of Onset   Diabetes Other    Cancer Other    Hyperlipidemia Father     Social History Social History   Tobacco Use   Smoking status: Never   Smokeless tobacco: Never  Vaping Use   Vaping Use: Never used  Substance Use Topics   Alcohol use: Yes    Comment: occasionally    Drug use: Yes    Types: Marijuana    Comment: once or twice daily     Allergies   Penicillins   Review of Systems Review of Systems Per HPI  Physical Exam Triage Vital Signs ED Triage Vitals [10/26/22 0924]  Enc Vitals Group     BP 111/73     Pulse Rate 78     Resp 20     Temp 98.6 F (37 C)     Temp Source Oral     SpO2 96 %     Weight      Height      Head Circumference      Peak Flow      Pain Score 5     Pain Loc      Pain Edu?      Excl. in [place]?    No data found.  Updated Vital Signs BP 111/73 (BP Location: Right Arm)   Pulse 78   Temp 98.6  F (37 C) (Oral)   Resp 20   SpO2 96%   Visual Acuity Right Eye Distance:   Left Eye Distance:   Bilateral Distance:    Right Eye Near:   Left Eye Near:    Bilateral Near:     Physical Exam Vitals and nursing note reviewed.  Constitutional:      General: He is not in acute distress.    Appearance: Normal appearance. He is not ill-appearing or toxic-appearing.  HENT:     Head: Normocephalic and atraumatic.     Right Ear: Tympanic membrane, ear canal and external ear normal.     Left Ear: Tympanic membrane, ear canal and external ear normal.     Nose: Congestion present. No rhinorrhea.     Mouth/Throat:     Mouth: Mucous membranes are moist.     Pharynx: Oropharynx is clear. No oropharyngeal exudate or posterior oropharyngeal erythema.  Eyes:     General: No scleral icterus.    Extraocular Movements: Extraocular movements intact.  Cardiovascular:     Rate and Rhythm: Normal rate and regular rhythm.  Pulmonary:     Effort: Pulmonary effort is normal.  No respiratory distress.     Breath sounds: Normal breath sounds. No wheezing, rhonchi or rales.  Abdominal:     General: Abdomen is flat. Bowel sounds are normal. There is no distension.     Palpations: Abdomen is soft.     Tenderness: There is no abdominal tenderness.  Musculoskeletal:     Cervical back: Normal range of motion and neck supple.  Lymphadenopathy:     Cervical: No cervical adenopathy.  Skin:    General: Skin is warm and dry.     Coloration: Skin is not jaundiced or pale.     Findings: No erythema or rash.  Neurological:     Mental Status: He is alert and oriented to person, place, and time.  Psychiatric:        Behavior: Behavior is cooperative.      UC Treatments / Results  Labs (all labs ordered are listed, but only abnormal results are displayed) Labs Reviewed  SARS CORONAVIRUS 2 (TAT 6-24 HRS)    EKG   Radiology No results found.  Procedures Procedures (including critical care time)  Medications Ordered in UC Medications - No data to display  Initial Impression / Assessment and Plan / UC Course  I have reviewed the triage vital signs and the nursing notes.  Pertinent labs & imaging results that were available during my care of the patient were reviewed by me and considered in my medical decision making (see chart for details).   Patient is well-appearing, normotensive, afebrile, not tachycardic, not tachypneic, oxygenating well on room air.    1. Encounter for screening for COVID-19 2. Viral URI with cough Suspect viral etiology COVID-19 testing obtained; unable to test for influenza secondary to national shortage of testing materials Supportive care discussed with patient Specifically recommended use of guaifenesin/Mucinex, nasal saline rinses ER and return precautions discussed with patient Note given for work  The patient was given the opportunity to ask questions.  All questions answered to their satisfaction.  The patient is in  agreement to this plan.    Final Clinical Impressions(s) / UC Diagnoses   Final diagnoses:  Encounter for screening for COVID-19  Viral URI with cough     Discharge Instructions      You have a viral upper respiratory infection.  Symptoms should improve over the next  week to 10 days.  If you develop chest pain or shortness of breath, go to the emergency room.  We have tested you today for COVID-19.  You will see the results in Mychart and we will call you with positive results.    Please stay home and isolate until you are aware of the results.    Some things that can make you feel better are: - Increased rest - Increasing fluid with water/sugar free electrolytes - Acetaminophen and ibuprofen as needed for fever/pain - Salt water gargling, chloraseptic spray and throat lozenges for sore throat - OTC guaifenesin (Mucinex) 600 mg twice daily for congestion - Saline sinus flushes or a neti pot for nasal congestion - Humidifying the air     ED Prescriptions   None    PDMP not reviewed this encounter.   Eulogio Bear, NP 10/26/22 1004

## 2022-10-26 NOTE — ED Triage Notes (Signed)
Pt reports fever, body aches, and a cough x 2 days.

## 2022-10-26 NOTE — Discharge Instructions (Signed)
You have a viral upper respiratory infection.  Symptoms should improve over the next week to 10 days.  If you develop chest pain or shortness of breath, go to the emergency room.  We have tested you today for COVID-19.  You will see the results in Mychart and we will call you with positive results.    Please stay home and isolate until you are aware of the results.    Some things that can make you feel better are: - Increased rest - Increasing fluid with water/sugar free electrolytes - Acetaminophen and ibuprofen as needed for fever/pain - Salt water gargling, chloraseptic spray and throat lozenges for sore throat - OTC guaifenesin (Mucinex) 600 mg twice daily for congestion - Saline sinus flushes or a neti pot for nasal congestion - Humidifying the air

## 2022-10-27 LAB — SARS CORONAVIRUS 2 (TAT 6-24 HRS): SARS Coronavirus 2: NEGATIVE
